# Patient Record
Sex: Male | Born: 1985 | Race: White | Hispanic: No | Marital: Single | Smoking: Heavy tobacco smoker
Health system: Southern US, Community
[De-identification: ages and names within clinical notes are randomized; demographics above are authoritative.]

---

## 2008-03-30 ENCOUNTER — Emergency Department (HOSPITAL_COMMUNITY): Admission: EM | Admit: 2008-03-30 | Discharge: 2008-03-31 | Payer: Self-pay | Admitting: Emergency Medicine

## 2014-01-03 ENCOUNTER — Emergency Department (HOSPITAL_COMMUNITY)
Admission: EM | Admit: 2014-01-03 | Discharge: 2014-01-04 | Disposition: A | Discharge status: Home / Self Care | Payer: Self-pay | Attending: Emergency Medicine | Admitting: Emergency Medicine

## 2014-01-03 ENCOUNTER — Encounter (HOSPITAL_COMMUNITY): Payer: Self-pay | Admitting: Emergency Medicine

## 2014-01-03 DIAGNOSIS — F17200 Nicotine dependence, unspecified, uncomplicated: Secondary | ICD-10-CM | POA: Insufficient documentation

## 2014-01-03 DIAGNOSIS — IMO0002 Reserved for concepts with insufficient information to code with codable children: Secondary | ICD-10-CM | POA: Insufficient documentation

## 2014-01-03 DIAGNOSIS — F1010 Alcohol abuse, uncomplicated: Secondary | ICD-10-CM | POA: Insufficient documentation

## 2014-01-03 DIAGNOSIS — R7989 Other specified abnormal findings of blood chemistry: Secondary | ICD-10-CM

## 2014-01-03 DIAGNOSIS — F1310 Sedative, hypnotic or anxiolytic abuse, uncomplicated: Secondary | ICD-10-CM | POA: Insufficient documentation

## 2014-01-03 DIAGNOSIS — F1210 Cannabis abuse, uncomplicated: Secondary | ICD-10-CM | POA: Insufficient documentation

## 2014-01-03 DIAGNOSIS — R945 Abnormal results of liver function studies: Secondary | ICD-10-CM | POA: Insufficient documentation

## 2014-01-03 DIAGNOSIS — F411 Generalized anxiety disorder: Secondary | ICD-10-CM | POA: Insufficient documentation

## 2014-01-03 LAB — CBC WITH DIFFERENTIAL/PLATELET
Basophils Absolute: 0 10*3/uL (ref 0.0–0.1)
Basophils Relative: 1 % (ref 0–1)
EOS ABS: 0 10*3/uL (ref 0.0–0.7)
EOS PCT: 0 % (ref 0–5)
HEMATOCRIT: 39.8 % (ref 39.0–52.0)
HEMOGLOBIN: 14.2 g/dL (ref 13.0–17.0)
LYMPHS ABS: 1.7 10*3/uL (ref 0.7–4.0)
Lymphocytes Relative: 30 % (ref 12–46)
MCH: 33.4 pg (ref 26.0–34.0)
MCHC: 35.7 g/dL (ref 30.0–36.0)
MCV: 93.6 fL (ref 78.0–100.0)
MONOS PCT: 13 % — AB (ref 3–12)
Monocytes Absolute: 0.7 10*3/uL (ref 0.1–1.0)
Neutro Abs: 3.1 10*3/uL (ref 1.7–7.7)
Neutrophils Relative %: 56 % (ref 43–77)
Platelets: 159 10*3/uL (ref 150–400)
RBC: 4.25 MIL/uL (ref 4.22–5.81)
RDW: 13.1 % (ref 11.5–15.5)
WBC: 5.5 10*3/uL (ref 4.0–10.5)

## 2014-01-03 LAB — COMPREHENSIVE METABOLIC PANEL
ALBUMIN: 4.5 g/dL (ref 3.5–5.2)
ALT: 319 U/L — AB (ref 0–53)
AST: 387 U/L — AB (ref 0–37)
Alkaline Phosphatase: 110 U/L (ref 39–117)
Anion gap: 17 — ABNORMAL HIGH (ref 5–15)
BUN: 5 mg/dL — ABNORMAL LOW (ref 6–23)
CALCIUM: 9 mg/dL (ref 8.4–10.5)
CO2: 25 mEq/L (ref 19–32)
CREATININE: 0.64 mg/dL (ref 0.50–1.35)
Chloride: 99 mEq/L (ref 96–112)
GFR calc Af Amer: >90 mL/min (ref >90)
GFR calc non Af Amer: >90 mL/min (ref >90)
Glucose, Bld: 118 mg/dL — ABNORMAL HIGH (ref 70–99)
Potassium: 3.6 mEq/L — ABNORMAL LOW (ref 3.7–5.3)
SODIUM: 141 meq/L (ref 137–147)
TOTAL PROTEIN: 8 g/dL (ref 6.0–8.3)
Total Bilirubin: 0.5 mg/dL (ref 0.3–1.2)

## 2014-01-03 LAB — RAPID URINE DRUG SCREEN, HOSP PERFORMED
Amphetamines: NOT DETECTED
BARBITURATES: NOT DETECTED
BENZODIAZEPINES: POSITIVE — AB
COCAINE: NOT DETECTED
Opiates: NOT DETECTED
TETRAHYDROCANNABINOL: POSITIVE — AB

## 2014-01-03 LAB — ETHANOL

## 2014-01-03 MED ORDER — NICOTINE 14 MG/24HR TD PT24
14.0000 mg | MEDICATED_PATCH | Freq: Once | TRANSDERMAL | Status: AC
  Administered 2014-01-03: 14 mg via TRANSDERMAL
  Filled 2014-01-03: qty 1

## 2014-01-03 MED ORDER — PROMETHAZINE HCL 25 MG/ML IJ SOLN
12.5000 mg | Freq: Once | INTRAMUSCULAR | Status: AC
Start: 2014-01-03 — End: 2014-01-03
  Administered 2014-01-03: 12.5 mg via INTRAVENOUS
  Filled 2014-01-03: qty 1

## 2014-01-03 MED ORDER — VITAMIN B-1 100 MG PO TABS
100.0000 mg | ORAL_TABLET | Freq: Every day | ORAL | Status: DC
  Administered 2014-01-03: 100 mg via ORAL
  Filled 2014-01-03: qty 1

## 2014-01-03 MED ORDER — LORAZEPAM 1 MG PO TABS
0.0000 mg | ORAL_TABLET | Freq: Four times a day (QID) | ORAL | Status: DC
  Administered 2014-01-03 – 2014-01-04 (×3): 1 mg via ORAL
  Filled 2014-01-03 (×3): qty 1

## 2014-01-03 MED ORDER — LORAZEPAM 1 MG PO TABS
1.0000 mg | ORAL_TABLET | Freq: Once | ORAL | Status: AC
  Administered 2014-01-03: 1 mg via ORAL
  Filled 2014-01-03: qty 1

## 2014-01-03 MED ORDER — ACETAMINOPHEN 325 MG PO TABS
650.0000 mg | ORAL_TABLET | Freq: Once | ORAL | Status: AC
  Administered 2014-01-03: 650 mg via ORAL
  Filled 2014-01-03: qty 2

## 2014-01-03 MED ORDER — ONDANSETRON 4 MG PO TBDP
4.0000 mg | ORAL_TABLET | Freq: Once | ORAL | Status: AC
  Administered 2014-01-03: 4 mg via ORAL
  Filled 2014-01-03: qty 1

## 2014-01-03 MED ORDER — THIAMINE HCL 100 MG/ML IJ SOLN: 100.0000 mg | Freq: Every day | INTRAMUSCULAR | Status: DC

## 2014-01-03 NOTE — BH Assessment (Signed)
TTS assessment in progress.   Wyllow Seigler, MS, LCASA Assessment Counselor  

## 2014-01-03 NOTE — BH Assessment (Signed)
TTS assessment complete.  Evanna Washinton, MS, LCASA Assessment Counselor  

## 2014-01-03 NOTE — BH Assessment (Signed)
TTS assessment complete.  Ciria Bernardini, MS, LCASA Assessment Counselor  

## 2014-01-03 NOTE — ED Provider Notes (Signed)
Medical screening examination/treatment/procedure(s) were performed by non-physician practitioner and as supervising physician I was immediately available for consultation/collaboration.   EKG Interpretation None       Roann Merk, MD 01/03/14 1503 

## 2014-01-03 NOTE — BH Assessment (Signed)
Pt assessment delayed due to issues with connectivity issues.   Octavis Sheeler, MS, LCASA  Assessment Counselor  

## 2014-01-03 NOTE — ED Provider Notes (Signed)
Thomas Wong: [id number]     Arrival date & time 01/03/14  4098 History   First MD Initiated Contact with Patient 01/03/14 1015     Chief Complaint  Patient presents with  . Alcohol Problem     (Consider location/radiation/quality/duration/timing/severity/associated sxs/prior Treatment) The history is provided by the patient, the spouse and a parent.   [name] is a 28 y.o. male requesting assistance with detox from alcohol.  Thomas Wong reports typically drinking 16+ beers per day and also drank rubbing alcohol approximately 3 hours ago this morning to avoid seizures.  Thomas Wong reports history of withdrawal seizures last occurring 2 days ago at which time Thomas Wong was evaluated at [facility].  Thomas Wong was given outpatient referral information, but feels like Thomas Wong needs more urgent intervention.  Thomas Wong reports feeling very anxious this morning.  Thomas Wong denies other complaints such as vomiting, confusion, chest pain, sob,  but has been nauseated. Thomas Wong denies abdominal pain.   History reviewed. No pertinent past medical history. History reviewed. No pertinent past surgical history. History reviewed. No pertinent family history. History  Substance Use Topics  . Smoking status: Heavy Tobacco Smoker -- 2.00 packs/day    Types: Cigarettes  . Smokeless tobacco: Not on file  . Alcohol Use: 120.0 oz/week    200 Cans of beer per week    Review of Systems  Constitutional: Negative for fever and chills.  HENT: Negative for congestion and sore throat.   Eyes: Negative.   Respiratory: Negative for chest tightness and shortness of breath.   Cardiovascular: Negative for chest pain.  Gastrointestinal: Negative for nausea, vomiting and abdominal pain.  Genitourinary: Negative.   Musculoskeletal: Negative for arthralgias, joint swelling and neck pain.  Skin: Negative.  Negative for rash and wound.  Neurological: Negative for dizziness, weakness, light-headedness, numbness and headaches.  Psychiatric/Behavioral: Negative for  suicidal ideas and self-injury. The patient is nervous/anxious.       Allergies  Review of patient's allergies indicates no known allergies.  Home Medications   Prior to Admission medications   Medication Sig Start Date End Date Taking? Authorizing Provider  albuterol (PROVENTIL HFA;VENTOLIN HFA) 108 (90 BASE) MCG/ACT inhaler Inhale 3 puffs into the lungs every 6 (six) hours as needed for wheezing or shortness of breath.   Yes Historical Provider, MD  DiphenhydrAMINE HCl (BENADRYL ALLERGY PO) Take 2 tablets by mouth as needed (sleep).   Yes Historical Provider, MD  diphenhydramine-acetaminophen (TYLENOL PM) 25-500 MG TABS Take 3 tablets by mouth as needed.   Yes Historical Provider, MD  ibuprofen (ADVIL,MOTRIN) 200 MG tablet Take 600 mg by mouth every 6 (six) hours as needed.    Yes Historical Provider, MD  Multiple Vitamins-Minerals (MULTIVITAMINS THER. W/MINERALS) TABS tablet Take 1 tablet by mouth daily.   Yes Historical Provider, MD   BP 138/91  Pulse 105  Temp(Src) 98.2 F (36.8 C) (Oral)  Resp 16  Ht  (1.727 m)  Wt 145 lb (65.772 kg)  BMI 22.05 kg/m2  SpO2 100% Physical Exam  Nursing note and vitals reviewed. Constitutional: Thomas Wong appears well-developed and well-nourished.  HENT:  Head: Normocephalic and atraumatic.  Eyes: Conjunctivae are normal.  Neck: Normal range of motion.  Cardiovascular: Normal rate, regular rhythm, normal heart sounds and intact distal pulses.   Pulmonary/Chest: Effort normal. Thomas Wong has wheezes. Thomas Wong exhibits no tenderness.  Bilateral lower expiratory wheeze which resolves after several breaths  Abdominal: Soft. Bowel sounds are normal. There is no tenderness.  Musculoskeletal: Normal range of motion.  Neurological:  Thomas Wong is alert.  Skin: Skin is warm and dry.  Psychiatric: His speech is normal. His mood appears anxious. Thomas Wong is agitated.    ED Course  Procedures (including critical care time) Labs Review Labs Reviewed  URINE RAPID DRUG SCREEN  (HOSP PERFORMED) - Abnormal; Notable for the following:    Benzodiazepines POSITIVE (*)    Tetrahydrocannabinol POSITIVE (*)    All other components within normal limits  COMPREHENSIVE METABOLIC PANEL - Abnormal; Notable for the following:    Potassium 3.6 (*)    Glucose, Bld 118 (*)    BUN 5 (*)    AST 387 (*)    ALT 319 (*)    Anion gap 17 (*)    All other components within normal limits  CBC WITH DIFFERENTIAL - Abnormal; Notable for the following:    Monocytes Relative 13 (*)    All other components within normal limits  ETHANOL    Imaging Review No results found.   EKG Interpretation None      MDM   Final diagnoses:  ETOH abuse  Elevated LFTs    Pt was given IV fluids, zofran, placed on CIWA protocol for acute etoh withdrawal. Will have ttp evaluate for detox/placement.  Labs reviewed. Elevated lft's.  Pt denies any prior liver problems.      Burgess Amor, PA-C 01/03/14 1405  Burgess Amor, PA-C 01/03/14 1455

## 2014-01-03 NOTE — ED Notes (Signed)
Pt states he wants help for alcohol addiction, typically drinks 16 beers a day, last beer yesterday but did drink rubbing alcohol this morning. Pt seen for same at [facility] but received no help. Pt states he has had seizures for trying to self detox.

## 2014-01-03 NOTE — BH Assessment (Signed)
This Clinical research associate spoke with Lynden Ang at [facility] who reports "It is likely that we will have a bed available" she recommended that patient referral information be faxed for review.   Spoke with Andrey Campanile at [facility] whom reports that they have beds available. Oncoming shift will need to fax patient referral information to [facility] for review and bed consideration.  Glorious Peach, MS, LCASA Assessment Counselor

## 2014-01-03 NOTE — BH Assessment (Signed)
Tele Assessment Note   Thomas Wong is an 28 y.o. male. Pt presents to [facility] with c/o medical clearance and is requesting inpatient detox from [facility]. Patient reports that he is looking for longer term substance abuse treatment if available. Pt reports that he has been drinking etoh excessively daily for the past 6 years. Patient reports that he last drank etoh on 01/03/14 reporting that he can't remember how much etoh he drank. Pt denies the use of any other elicit drugs or secondary substances but UDS is + for THC. Pt reports that he uses Xanax bars when he can't get access to etoh. Patient reports that he consumed a Xanax bar 3-4 days ago. Pt suspects that he had a seizure last weekend but did not seek medical attention to confirm. Pt denies history of D/T's but reports hand tremors and shakes during withdraw from etoh. Pt denies SI,HI, and no AVH reported.  Consulted with [facility] and Dr. Lucianne Muss whom is recommending that patient be referred to other facilities for detox treatment. [facility] have no available [address] beds at this time.  Axis I: Alcohol Use Disorder Axis II: Deferred Axis III: History reviewed. No pertinent past medical history. Axis IV: economic problems, problems related to legal system/crime and problems related to social environment Axis V: 41-50 serious symptoms  Past Medical History: History reviewed. No pertinent past medical history.  History reviewed. No pertinent past surgical history.  Family History: History reviewed. No pertinent family history.  Social History:  reports that he has been smoking Cigarettes.  He has been smoking about 2.00 packs per day. He does not have any smokeless tobacco history on file. He reports that he drinks about 120 ounces of alcohol per week. He reports that he uses illicit drugs (Marijuana).  Additional Social History:  Alcohol / Drug Use History of alcohol / drug use?: Yes Negative Consequences of Use: Financial;Personal  relationships;Legal Substance #1 Name of Substance 1:  (Etoh-Beer) 1 - Age of First Use:  (18) 1 - Amount (size/oz):  (12-16 (12)oz beers) 1 - Frequency:  (daily) 1 - Duration:  (pt reports daily use for the past 6 years) 1 - Last Use / Amount:  (01/03/14-Pt is unable to specify amount)  CIWA: CIWA-Ar BP: 133/76 mmHg Pulse Rate: 103 Nausea and Vomiting: mild nausea with no vomiting Tactile Disturbances: none Tremor: two Auditory Disturbances: not present Paroxysmal Sweats: no sweat visible Visual Disturbances: not present Anxiety: mildly anxious Headache, Fullness in Head: none present Agitation: somewhat more than normal activity Orientation and Clouding of Sensorium: oriented and can do serial additions CIWA-Ar Total: 5 COWS:    PATIENT STRENGTHS: (choose at least two) Average or above average intelligence Supportive family/friends  Allergies: No Known Allergies  Home Medications:  (Not in a hospital admission)  OB/GYN Status:  No LMP for male patient.  General Assessment Data Location of Assessment: [facility] Is this a Tele or Face-to-Face Assessment?: Tele Assessment Is this an Initial Assessment or a Re-assessment for this encounter?: Initial Assessment Living Arrangements: Non-relatives/Friends (pt reports that he lives with his girlfriend) Can pt return to current living arrangement?: Yes Admission Status: Voluntary Is patient capable of signing voluntary admission?: Yes Transfer from: Unknown Referral Source: MD     [facility] Crisis Care Plan Living Arrangements: Non-relatives/Friends (pt reports that he lives with his girlfriend) Name of Psychiatrist: No Current Provider Name of Therapist: No Current Provider     Risk to self with the past 6 months Suicidal  Ideation: No Suicidal Intent: No Is patient at risk for suicide?: No Suicidal Plan?: No Access to Means: No What has been your use of drugs/alcohol within the last 12 months?: Etoh, THC, Xanax  Bars Previous Attempts/Gestures: No How many times?: 0 Other Self Harm Risks: none reported Triggers for Past Attempts: None known Intentional Self Injurious Behavior: None Family Suicide History: No Persecutory voices/beliefs?: No Depression: Yes Depression Symptoms: Feeling angry/irritable Substance abuse history and/or treatment for substance abuse?: Yes  Risk to Others within the past 6 months Homicidal Ideation: No Thoughts of Harm to Others: No Current Homicidal Intent: No Current Homicidal Plan: No Access to Homicidal Means: No Identified Victim: na History of harm to others?: No Assessment of Violence: None Noted Violent Behavior Description: None Noted Does patient have access to weapons?: No Criminal Charges Pending?: No Does patient have a court date: No  Psychosis Hallucinations: None noted Delusions: None noted  Mental Status Report Appear/Hygiene: Other (Comment) (casual) Eye Contact: Fair Motor Activity: Freedom of movement Speech: Logical/coherent Level of Consciousness: Alert;Irritable Mood: Anxious;Angry;Irritable Affect: Irritable Anxiety Level: Minimal Thought Processes: Coherent;Relevant Judgement: Impaired Orientation: Person;Place;Time;Situation Obsessive Compulsive Thoughts/Behaviors: None  Cognitive Functioning Concentration: Normal Memory: Recent Intact;Remote Intact IQ: Average Insight: Poor Impulse Control: Fair Appetite: Poor Weight Loss: 0 Weight Gain: 0 Sleep: Decreased Total Hours of Sleep:  (4-6 hours of sleep per night) Vegetative Symptoms: None  ADLScreening [facility] Assessment Services) Patient's cognitive ability adequate to safely complete daily activities?: Yes Patient able to express need for assistance with ADLs?: Yes Independently performs ADLs?: Yes (appropriate for developmental age)  Prior Inpatient Therapy Prior Inpatient Therapy: No Prior Therapy Dates: na Prior Therapy Facilty/Provider(s): na Reason for  Treatment: na  Prior Outpatient Therapy Prior Outpatient Therapy: No Prior Therapy Dates: na Prior Therapy Facilty/Provider(s): na Reason for Treatment: na  ADL Screening (condition at time of admission) Patient's cognitive ability adequate to safely complete daily activities?: Yes Is the patient deaf or have difficulty hearing?: No Does the patient have difficulty seeing, even when wearing glasses/contacts?: No Does the patient have difficulty concentrating, remembering, or making decisions?: No Patient able to express need for assistance with ADLs?: Yes Does the patient have difficulty dressing or bathing?: No Independently performs ADLs?: Yes (appropriate for developmental age) Does the patient have difficulty walking or climbing stairs?: No Weakness of Legs: None Weakness of Arms/Hands: None  Home Assistive Devices/Equipment Home Assistive Devices/Equipment: None    Abuse/Neglect Assessment (Assessment to be complete while patient is alone) Physical Abuse: Denies Verbal Abuse: Denies Sexual Abuse: Denies Exploitation of patient/patient's resources: Denies Values / Beliefs Cultural Requests During Hospitalization: None Spiritual Requests During Hospitalization: None   Advance Directives (For Healthcare) Does patient have an advance directive?: No Would patient like information on creating an advanced directive?: No - patient declined information    Additional Information 1:1 In Past 12 Months?: No CIRT Risk: No Elopement Risk: No Does patient have medical clearance?: Yes     Disposition:  Disposition Initial Assessment Completed for this Encounter: Yes Disposition of Patient: Other dispositions (Per Dr.Kumar pt needs to be referred to [facility] or [facility] for tx.) Other disposition(s): Other (Comment)  Bjorn Pippin 01/03/2014 6:52 PM

## 2014-01-03 NOTE — BH Assessment (Signed)
TTS assessment in progress.   [name], MS, LCASA Assessment Counselor  

## 2014-01-03 NOTE — BH Assessment (Signed)
Pt assessment delayed due to issues with connectivity issues.   Glorious Peach, MS, LCASA  Assessment Counselor

## 2014-01-03 NOTE — BH Assessment (Addendum)
Attempted to reach ED provider prior to assessment unable to reach ED provider and evaluated patient so that patient care would not be delayed.   Glorious Peach, MS, LCASA  Assessment Counselor

## 2014-01-03 NOTE — ED Notes (Signed)
Telepsych in progress. 

## 2014-01-03 NOTE — BH Assessment (Signed)
Attempted to reach ED provider prior to assessment unable to reach ED provider and evaluated patient so that patient care would not be delayed.   Malaak Stach, MS, LCASA  Assessment Counselor       

## 2014-01-04 NOTE — BH Assessment (Signed)
[facility] Assessment Progress Note   This clinician called Sandy at [facility] to see if they had male beds available.  They do.  Referral sent to them for review.

## 2014-01-04 NOTE — ED Provider Notes (Signed)
Patient in the emergency room for substance abuse problems.   Vital signs are stable this morning.  Patient is resting comfortably.  We'll continue to monitor pending psychiatric placement  Linwood Dibbles, MD 01/04/14 [phone]

## 2021-04-05 ENCOUNTER — Other Ambulatory Visit: Payer: Self-pay

## 2021-04-05 ENCOUNTER — Emergency Department (HOSPITAL_COMMUNITY): Payer: BC Managed Care – PPO

## 2021-04-05 ENCOUNTER — Emergency Department (HOSPITAL_COMMUNITY)
Admission: EM | Admit: 2021-04-05 | Discharge: 2021-04-05 | Disposition: A | Discharge status: Home / Self Care | Payer: BC Managed Care – PPO | Attending: Emergency Medicine | Admitting: Emergency Medicine

## 2021-04-05 ENCOUNTER — Encounter (HOSPITAL_COMMUNITY): Payer: Self-pay

## 2021-04-05 DIAGNOSIS — J320 Chronic maxillary sinusitis: Secondary | ICD-10-CM | POA: Diagnosis not present

## 2021-04-05 DIAGNOSIS — F17210 Nicotine dependence, cigarettes, uncomplicated: Secondary | ICD-10-CM | POA: Diagnosis not present

## 2021-04-05 DIAGNOSIS — R220 Localized swelling, mass and lump, head: Secondary | ICD-10-CM | POA: Diagnosis present

## 2021-04-05 DIAGNOSIS — K047 Periapical abscess without sinus: Secondary | ICD-10-CM | POA: Diagnosis not present

## 2021-04-05 DIAGNOSIS — K029 Dental caries, unspecified: Secondary | ICD-10-CM | POA: Insufficient documentation

## 2021-04-05 DIAGNOSIS — L03211 Cellulitis of face: Secondary | ICD-10-CM | POA: Insufficient documentation

## 2021-04-05 LAB — COMPREHENSIVE METABOLIC PANEL
ALT: 17 U/L (ref 0–44)
AST: 21 U/L (ref 15–41)
Albumin: 3.7 g/dL (ref 3.5–5.0)
Alkaline Phosphatase: 84 U/L (ref 38–126)
Anion gap: 10 (ref 5–15)
BUN: 15 mg/dL (ref 6–20)
CO2: 27 mmol/L (ref 22–32)
Calcium: 8.7 mg/dL — ABNORMAL LOW (ref 8.9–10.3)
Chloride: 102 mmol/L (ref 98–111)
Creatinine, Ser: 0.62 mg/dL (ref 0.61–1.24)
GFR, Estimated: >60 mL/min (ref >60)
Glucose, Bld: 87 mg/dL (ref 70–99)
Potassium: 3.9 mmol/L (ref 3.5–5.1)
Sodium: 139 mmol/L (ref 135–145)
Total Bilirubin: 0.1 mg/dL — ABNORMAL LOW (ref 0.3–1.2)
Total Protein: 6.8 g/dL (ref 6.5–8.1)

## 2021-04-05 LAB — CBC
HCT: 31.2 % — ABNORMAL LOW (ref 39.0–52.0)
Hemoglobin: 10.3 g/dL — ABNORMAL LOW (ref 13.0–17.0)
MCH: 29.6 pg (ref 26.0–34.0)
MCHC: 33 g/dL (ref 30.0–36.0)
MCV: 89.7 fL (ref 80.0–100.0)
Platelets: 265 10*3/uL (ref 150–400)
RBC: 3.48 MIL/uL — ABNORMAL LOW (ref 4.22–5.81)
RDW: 13 % (ref 11.5–15.5)
WBC: 5.9 10*3/uL (ref 4.0–10.5)
nRBC: 0 % (ref 0.0–0.2)

## 2021-04-05 MED ORDER — SULFAMETHOXAZOLE-TRIMETHOPRIM 800-160 MG PO TABS: 1.0000 | ORAL_TABLET | Freq: Two times a day (BID) | ORAL | 0 refills | Status: AC

## 2021-04-05 MED ORDER — AMOXICILLIN-POT CLAVULANATE 875-125 MG PO TABS: 1.0000 | ORAL_TABLET | Freq: Two times a day (BID) | ORAL | 0 refills | Status: AC

## 2021-04-05 MED ORDER — IOHEXOL 300 MG/ML  SOLN
75.0000 mL | Freq: Once | INTRAMUSCULAR | Status: AC | PRN
  Administered 2021-04-05: 75 mL via INTRAVENOUS

## 2021-04-05 NOTE — ED Triage Notes (Signed)
Pt c/o dental abscess, pt has swelling to lower left jaw. Pt states his mother gave him amoxicillin last week and hasn't improved.

## 2021-04-05 NOTE — Discharge Instructions (Signed)
You were evaluated in the Emergency Department and after careful evaluation, we did not find any emergent condition requiring admission or further testing in the hospital.  Your exam/testing today was overall reassuring.  Symptoms seem to be due to a cellulitis of the face.  Take the antibiotics as directed.  Recommend following up with primary care doctor as well as dentist.  Please return to the Emergency Department if you experience any worsening of your condition.  Thank you for allowing [place] to be a part of your care.

## 2021-04-05 NOTE — ED Provider Notes (Signed)
[facility] Emergency Department Provider Note MRN:  [id number]  Arrival date & time: 04/05/21     Chief Complaint   Facial swelling History of Present Illness   Thomas Wong is a 35 y.o. year-old male with no pertinent past medical presenting to the ED with chief complaint of facial swelling.  Left lower tooth pain and facial swelling that started about a week ago.  Improved after taking some of family members amoxicillin but then stopped taking the amoxicillin and then it returned.  Restarted the amoxicillin but still getting worse.  Pain and swelling to the left side of the jaw, pain is minimal.  Has pain when he tries to bite down.  Denies fever, no other complaints.  Review of Systems  A complete 10 system review of systems was obtained and all systems are negative except as noted in the HPI and PMH.   Patient's Health History   History reviewed. No pertinent past medical history.  No past surgical history on file.  No family history on file.  Social History   Socioeconomic History   Marital status: Single    Spouse name: Not on file   Number of children: Not on file   Years of education: Not on file   Highest education level: Not on file  Occupational History   Not on file  Tobacco Use   Smoking status: Heavy Smoker    Packs/day: 2.00    Types: Cigarettes   Smokeless tobacco: Not on file  Substance and Sexual Activity   Alcohol use: Yes    Alcohol/week: 200.0 standard drinks    Types: 200 Cans of beer per week   Drug use: Yes    Types: Marijuana    Comment: pt tested + for [facility] but denied use on 01/03/14, reports that he consumes Xanax Bars when he cant get access to Etoh   Sexual activity: Not on file  Other Topics Concern   Not on file  Social History Narrative   Not on file   Social Determinants of Health   Financial Resource Strain: Not on file  Food Insecurity: Not on file  Transportation Needs: Not on file  Physical  Activity: Not on file  Stress: Not on file  Social Connections: Not on file  Intimate Partner Violence: Not on file     Physical Exam   Vitals:   04/05/21 0356  BP: 125/87  Pulse: 84  Resp: 20  Temp: 98.2 F (36.8 C)  SpO2: 100%    CONSTITUTIONAL: Well-appearing, NAD NEURO:  Alert and oriented x 3, no focal deficits EYES:  eyes equal and reactive ENT/NECK:  no LAD, no JVD; swelling and mild tenderness to the left mandible, generally poor dentition CARDIO: Regular rate, well-perfused, normal S1 and S2 PULM:  CTAB no wheezing or rhonchi GI/GU:  normal bowel sounds, non-distended, non-tender MSK/SPINE:  No gross deformities, no edema SKIN:  no rash, atraumatic PSYCH:  Appropriate speech and behavior  *Additional and/or pertinent findings included in MDM below  Diagnostic and Interventional Summary    EKG Interpretation  Date/Time:    Ventricular Rate:    PR Interval:    QRS Duration:   QT Interval:    QTC Calculation:   R Axis:     Text Interpretation:         Labs Reviewed  CBC - Abnormal; Notable for the following components:      Result Value   RBC 3.48 (*)    Hemoglobin 10.3 (*)  HCT 31.2 (*)    All other components within normal limits  COMPREHENSIVE METABOLIC PANEL - Abnormal; Notable for the following components:   Calcium 8.7 (*)    Total Bilirubin 0.1 (*)    All other components within normal limits    CT Maxillofacial W Contrast  Final Result      Medications  iohexol (OMNIPAQUE) 300 MG/ML solution 75 mL (75 mLs Intravenous Contrast Given 04/05/21 0552)     Procedures  /  Critical Care Procedures  ED Course and Medical Decision Making  I have reviewed the triage vital signs, the nursing notes, and pertinent available records from the EMR.  Listed above are laboratory and imaging tests that I personally ordered, reviewed, and interpreted and then considered in my medical decision making (see below for details).  CT to evaluate for  abscess or bony involvement.     CT revealing facial cellulitis, no abscess in the area of question.  Continues to look well, no significant worsening of swelling here, no airway compromise, normal vital signs, appropriate for discharge.  Given his use of amoxicillin at home without improvement, will escalate therapy.  Elmer Sow. Pilar Plate, MD [facility] Emergency Medicine [facility] [email or web address]  Final Clinical Impressions(s) / ED Diagnoses     ICD-10-CM   1. Cellulitis of face  L03.211     2. Tooth infection  K04.7       ED Discharge Orders          Ordered    sulfamethoxazole-trimethoprim (BACTRIM DS) 800-160 MG tablet  2 times daily        04/05/21 0650    amoxicillin-clavulanate (AUGMENTIN) 875-125 MG tablet  Every 12 hours        04/05/21 0650             Discharge Instructions Discussed with and Provided to Patient:     Discharge Instructions      You were evaluated in the Emergency Department and after careful evaluation, we did not find any emergent condition requiring admission or further testing in the hospital.  Your exam/testing today was overall reassuring.  Symptoms seem to be due to a cellulitis of the face.  Take the antibiotics as directed.  Recommend following up with primary care doctor as well as dentist.  Please return to the Emergency Department if you experience any worsening of your condition.  Thank you for allowing [place] to be a part of your care.         Sabas Sous, MD 04/05/21 [phone]

## 2022-12-14 IMAGING — CT CT MAXILLOFACIAL W/ CM
3 of 4 series · 16 of 47 positions shown, 19 images · IV contrast (Omnipaque or Isovue)
Comparison: None.

CLINICAL DATA: Dental abscess with left jaw swelling for over 2
weeks

EXAM:
CT MAXILLOFACIAL WITH CONTRAST
TECHNIQUE: Multidetector CT imaging of the maxillofacial structures was
performed with intravenous contrast. Multiplanar CT image
reconstructions were also generated.
CONTRAST:  75mL OMNIPAQUE IOHEXOL 300 MG/ML  SOLN

[Series 3: max soft · axial · 0.40mm/px · z∈[+10,+170]mm · 12 of 94 slices shown, 15 images]
[im 7/94  brain]
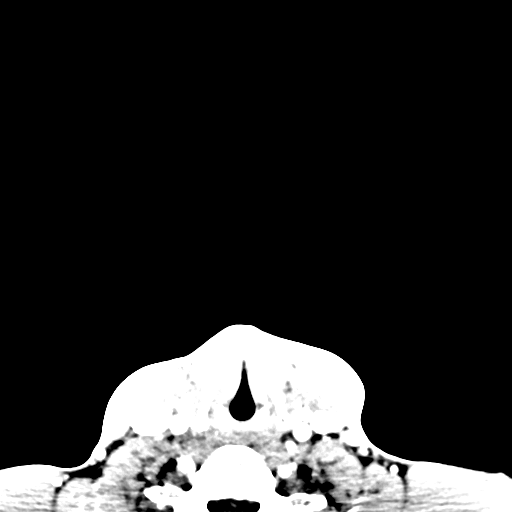
[im 7/94  bone]
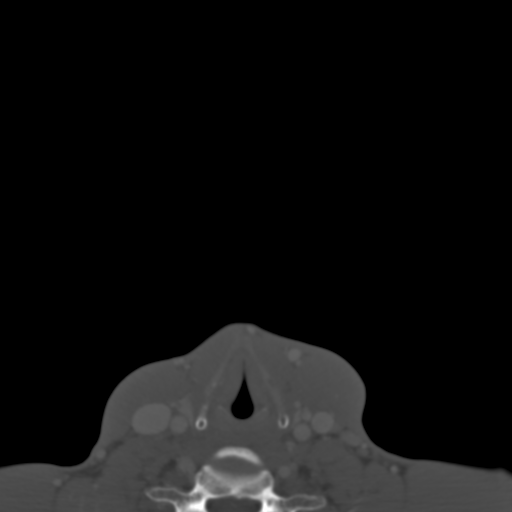
[im 13/94  bone]
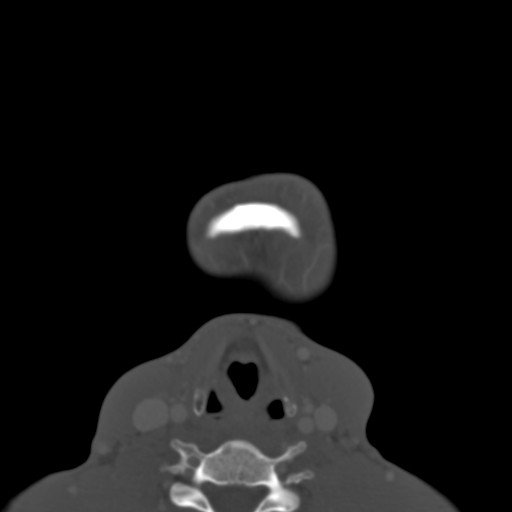
[im 20/94  bone]
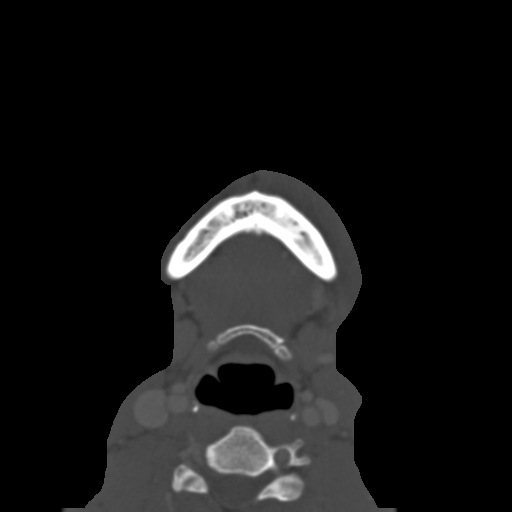
[im 29/94  bone]
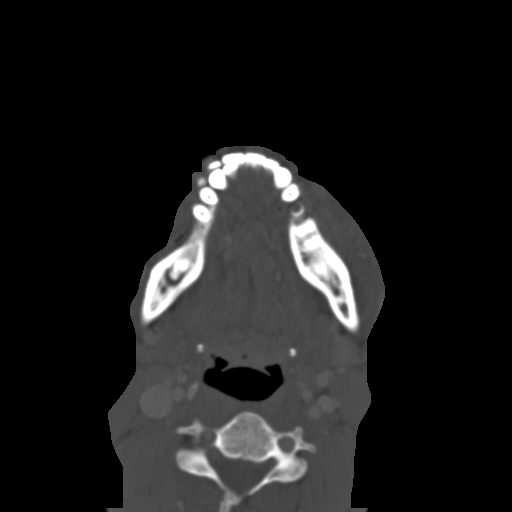
[im 36/94  brain]
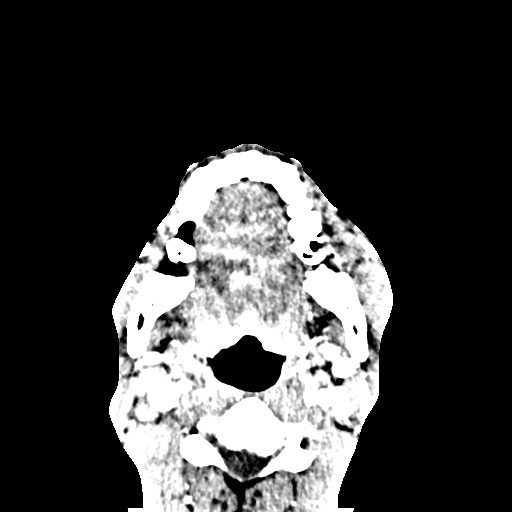
[im 36/94  bone]
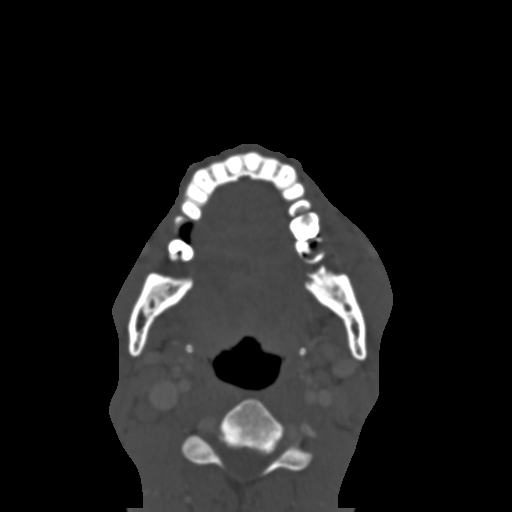
[im 42/94  bone]
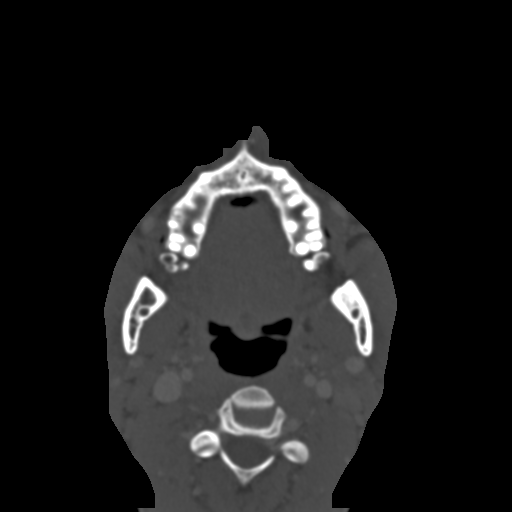
[im 52/94  bone]
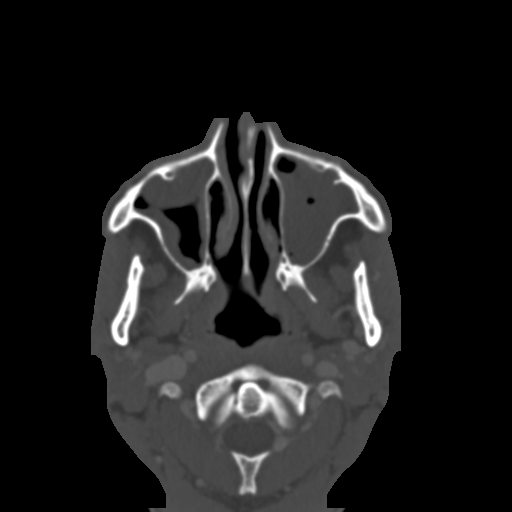
[im 58/94  bone]
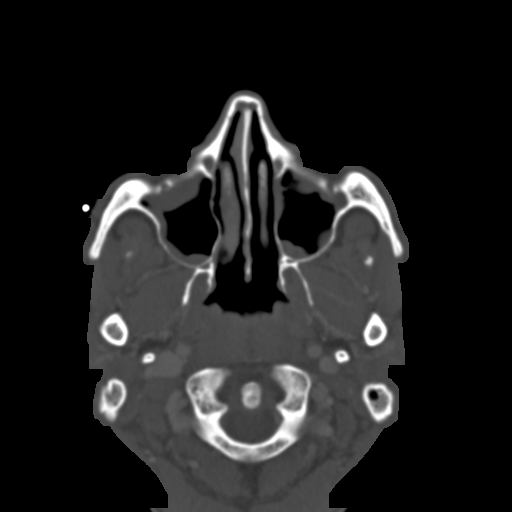
[im 65/94  brain]
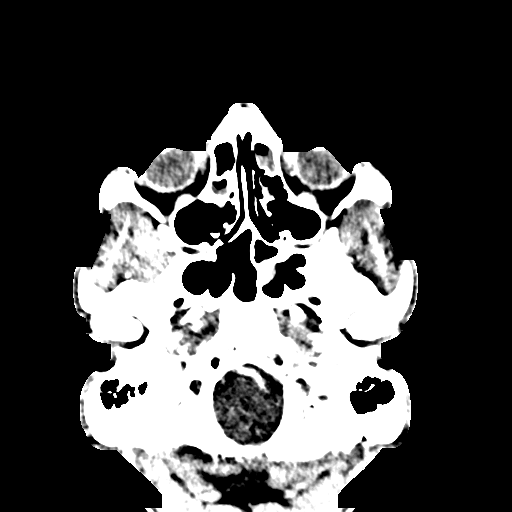
[im 65/94  bone]
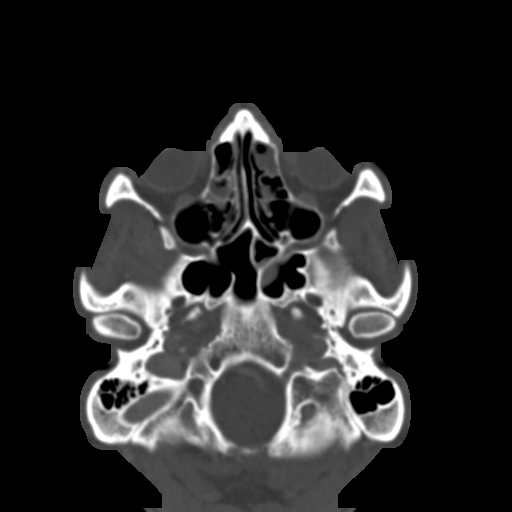
[im 74/94  bone]
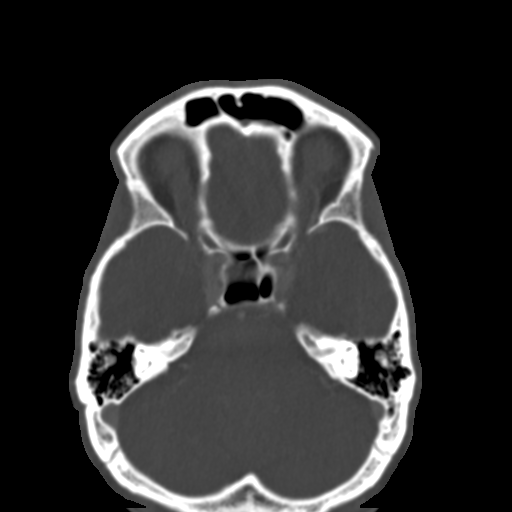
[im 81/94  bone]
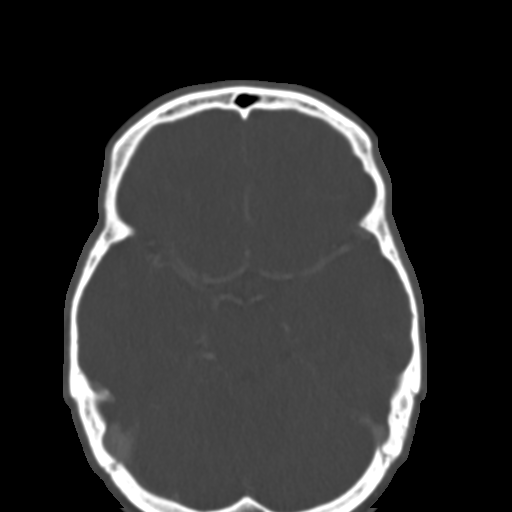
[im 87/94  bone]
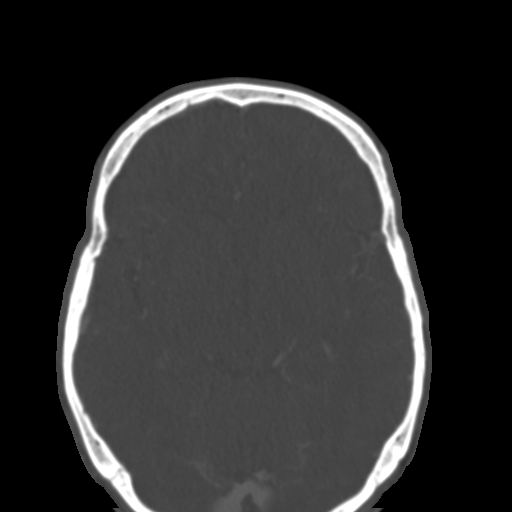

[Series 5: coronal soft · coronal · 0.38mm/px · 3 of 85 slices shown]
[im 29/85  bone]
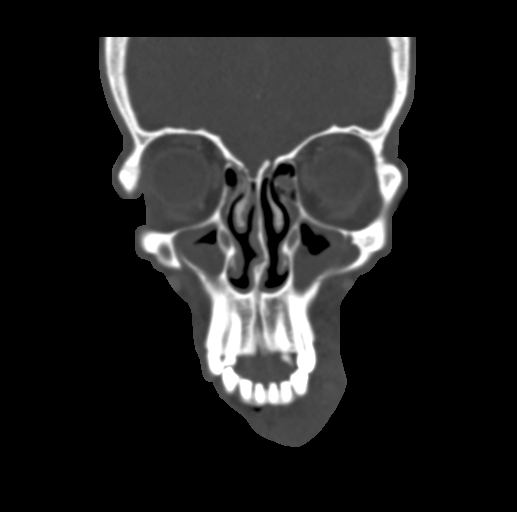
[im 38/85  bone]
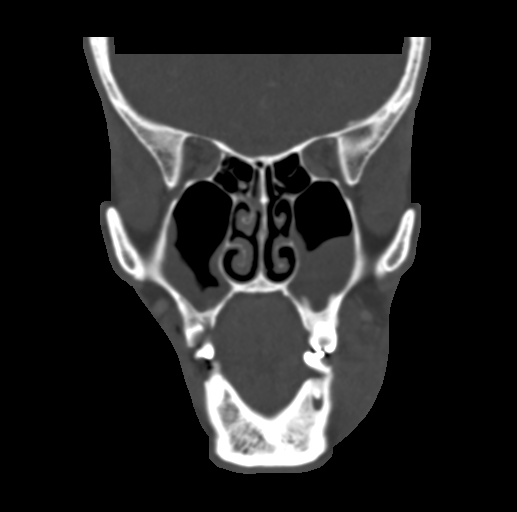
[im 47/85  bone]
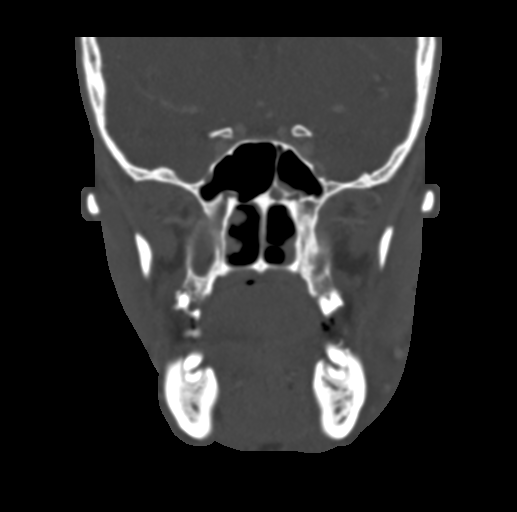

[Series 8: sagittal bone · sagittal · 0.38mm/px · 1 of 87 slices shown]
[im 44/87  bone]
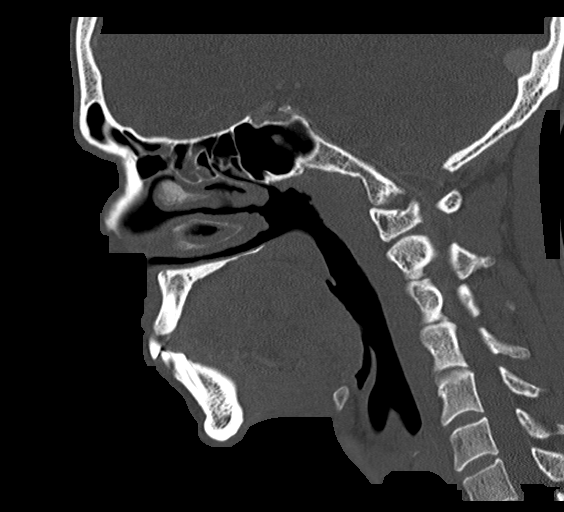

[16 of 47 positions shown; findings below may reference images not displayed]

FINDINGS: Osseous: Dental caries with periapical lucencies most notably
affecting tooth 20 where there is an anterior alveolar dehiscence
leading to cellulitic left cheek soft tissues. No drainable
collection seen at this level. Additional notable tooth 13 apical
dehiscence with rim enhancing collection in the inferior left
maxillary sinus, fluid measuring 3-4 mm. No evidence of
osteomyelitis. No floor of mouth swelling.

Orbits: Negative

Sinuses: Mucosal thickening in the bilateral maxillary sinuses, at
least partially odontogenic. Milder patchy opacification in the
ethmoids.

Soft tissues: Facial cellulitis as noted above

Limited intracranial: Negative
IMPRESSION: 1. Odontogenic cellulitis in the left face related to tooth 20.
2. Tooth 13 periapical dehiscence into the left maxillary sinus with
sinusitis and 13 mm collection.
3. Widespread advanced dental caries.

## 2023-04-21 ENCOUNTER — Other Ambulatory Visit: Payer: Self-pay

## 2023-04-21 ENCOUNTER — Encounter (HOSPITAL_COMMUNITY): Payer: Self-pay | Admitting: Emergency Medicine

## 2023-04-21 ENCOUNTER — Emergency Department (HOSPITAL_COMMUNITY)
Admission: EM | Admit: 2023-04-21 | Discharge: 2023-04-22 | Disposition: A | Discharge status: Home / Self Care | Payer: BC Managed Care – PPO | Attending: Emergency Medicine | Admitting: Emergency Medicine

## 2023-04-21 ENCOUNTER — Emergency Department (HOSPITAL_COMMUNITY): Payer: BC Managed Care – PPO

## 2023-04-21 DIAGNOSIS — L03012 Cellulitis of left finger: Secondary | ICD-10-CM | POA: Insufficient documentation

## 2023-04-21 LAB — CBC WITH DIFFERENTIAL/PLATELET
Abs Immature Granulocytes: 0.08 10*3/uL — ABNORMAL HIGH (ref 0.00–0.07)
Basophils Absolute: 0 10*3/uL (ref 0.0–0.1)
Basophils Relative: 0 %
Eosinophils Absolute: 0.2 10*3/uL (ref 0.0–0.5)
Eosinophils Relative: 2 %
HCT: 34.9 % — ABNORMAL LOW (ref 39.0–52.0)
Hemoglobin: 11.4 g/dL — ABNORMAL LOW (ref 13.0–17.0)
Immature Granulocytes: 1 %
Lymphocytes Relative: 26 %
Lymphs Abs: 2.1 10*3/uL (ref 0.7–4.0)
MCH: 29.2 pg (ref 26.0–34.0)
MCHC: 32.7 g/dL (ref 30.0–36.0)
MCV: 89.3 fL (ref 80.0–100.0)
Monocytes Absolute: 0.7 10*3/uL (ref 0.1–1.0)
Monocytes Relative: 8 %
Neutro Abs: 5.3 10*3/uL (ref 1.7–7.7)
Neutrophils Relative %: 63 %
Platelets: 387 10*3/uL (ref 150–400)
RBC: 3.91 MIL/uL — ABNORMAL LOW (ref 4.22–5.81)
RDW: 12.5 % (ref 11.5–15.5)
WBC: 8.4 10*3/uL (ref 4.0–10.5)
nRBC: 0 % (ref 0.0–0.2)

## 2023-04-21 NOTE — ED Triage Notes (Signed)
Pt in with ongoing pain and swelling to L distal index finger. Pt states he was dx with cellulitis and had the finger lanced 5 days ago at an UC. Pt states he has been on Clindamycin since, and maybe missed 2 doses. Finger red, warm and swollen

## 2023-04-22 LAB — COMPREHENSIVE METABOLIC PANEL
ALT: 15 U/L (ref 0–44)
AST: 14 U/L — ABNORMAL LOW (ref 15–41)
Albumin: 3.7 g/dL (ref 3.5–5.0)
Alkaline Phosphatase: 83 U/L (ref 38–126)
Anion gap: 9 (ref 5–15)
BUN: 18 mg/dL (ref 6–20)
CO2: 27 mmol/L (ref 22–32)
Calcium: 9.4 mg/dL (ref 8.9–10.3)
Chloride: 102 mmol/L (ref 98–111)
Creatinine, Ser: 0.84 mg/dL (ref 0.61–1.24)
GFR, Estimated: >60 mL/min (ref >60)
Glucose, Bld: 124 mg/dL — ABNORMAL HIGH (ref 70–99)
Potassium: 4 mmol/L (ref 3.5–5.1)
Sodium: 138 mmol/L (ref 135–145)
Total Bilirubin: 0.3 mg/dL (ref <1.2)
Total Protein: 7.6 g/dL (ref 6.5–8.1)

## 2023-04-22 MED ORDER — OXYCODONE-ACETAMINOPHEN 5-325 MG PO TABS
1.0000 | ORAL_TABLET | Freq: Once | ORAL | Status: AC
Start: 2023-04-22 — End: 2023-04-22
  Administered 2023-04-22: 1 via ORAL
  Filled 2023-04-22: qty 1

## 2023-04-22 MED ORDER — CHLORHEXIDINE GLUCONATE 4 % EX SOLN: Freq: Every day | CUTANEOUS | 0 refills | Status: AC | PRN

## 2023-04-22 MED ORDER — OXYCODONE-ACETAMINOPHEN 5-325 MG PO TABS: 1.0000 | ORAL_TABLET | Freq: Once | ORAL | Status: DC

## 2023-04-22 NOTE — ED Notes (Signed)
Pt is having white/pink discharge coming from left pointer finger tip

## 2023-04-22 NOTE — ED Notes (Signed)
Pt's left hand soaking in warm water.

## 2023-04-22 NOTE — Discharge Instructions (Signed)
You were seen today with ongoing concerns for redness and pain of the fingertip.  You have what is called a felon.  This is a pus collection at the tip of the finger.  Continue your antibiotics.  You need to soak in warm water and Hibiclens for 20 minutes 4 times daily to continue to allow it to drain.  Follow-up closely with hand surgery.  If you develop worsening swelling, redness, fevers or systemic symptoms, you should be reevaluated.

## 2023-04-22 NOTE — ED Notes (Signed)
ED Provider at bedside. 

## 2023-04-22 NOTE — ED Provider Notes (Signed)
[place] EMERGENCY DEPARTMENT AT [facility] Provider Note   CSN: [id number] Arrival date & time: 04/21/23  2248     History  Chief Complaint  Patient presents with   Hand Pain    Thomas Wong is a 37 y.o. male.  HPI     This is a 37 year old male who presents with concern for finger infection.  States he was seen and evaluated on December 5 and was told he had cellulitis of the finger.  He had incision and drainage of the finger and was started on clindamycin.  He has been mostly compliant with medication and maybe has missed 1-2 doses because of sleeping.  Otherwise he is noted that his finger has remained somewhat swollen.  He has some ongoing pain as well.  States that there has been some ongoing drainage after packing removal.  Has not had any fevers.  This is his nondominant hand.  Home Medications Prior to Admission medications   Medication Sig Start Date End Date Taking? Authorizing Provider  chlorhexidine (HIBICLENS) 4 % external liquid Apply topically daily as needed. 04/22/23  Yes Norleen Xie, Mayer Masker, MD  albuterol (PROVENTIL HFA;VENTOLIN HFA) 108 (90 BASE) MCG/ACT inhaler Inhale 3 puffs into the lungs every 6 (six) hours as needed for wheezing or shortness of breath.    [provider]  DiphenhydrAMINE HCl (BENADRYL ALLERGY PO) Take 2 tablets by mouth as needed (sleep).    [provider]  diphenhydramine-acetaminophen (TYLENOL PM) 25-500 MG TABS Take 3 tablets by mouth as needed.    [provider]  ibuprofen (ADVIL,MOTRIN) 200 MG tablet Take 600 mg by mouth every 6 (six) hours as needed.     [provider]  Multiple Vitamins-Minerals (MULTIVITAMINS THER. W/MINERALS) TABS tablet Take 1 tablet by mouth daily.    [provider]      Allergies    Patient has no known allergies.    Review of Systems   Review of Systems  Constitutional:  Negative for fever.  Musculoskeletal:        Finger pain  All other  systems reviewed and are negative.   Physical Exam Updated Vital Signs BP 129/85   Pulse 96   Temp 98.4 F (36.9 C) (Oral)   Resp 17   Wt 70.3 kg   SpO2 98%   BMI 23.57 kg/m  Physical Exam Vitals and nursing note reviewed.  Constitutional:      Appearance: He is well-developed. He is not ill-appearing.  HENT:     Head: Normocephalic and atraumatic.  Eyes:     Pupils: Pupils are equal, round, and reactive to light.  Cardiovascular:     Rate and Rhythm: Normal rate and regular rhythm.  Pulmonary:     Effort: Pulmonary effort is normal. No respiratory distress.  Musculoskeletal:     Cervical back: Neck supple.     Comments: Focused examination of the left hand with fusiform swelling of the left second digit, there is slight erythema circumferentially distal to the PIP joint, noted scabbing and incision on the dorsum of the pad of the finger with ongoing fluctuance noted  Lymphadenopathy:     Cervical: No cervical adenopathy.  Skin:    General: Skin is warm and dry.  Neurological:     Mental Status: He is alert and oriented to person, place, and time.  Psychiatric:        Mood and Affect: Mood normal.        ED Results /  Procedures / Treatments   Labs (all labs ordered are listed, but only abnormal results are displayed) Labs Reviewed  COMPREHENSIVE METABOLIC PANEL - Abnormal; Notable for the following components:      Result Value   Glucose, Bld 124 (*)    AST 14 (*)    All other components within normal limits  CBC WITH DIFFERENTIAL/PLATELET - Abnormal; Notable for the following components:   RBC 3.91 (*)    Hemoglobin 11.4 (*)    HCT 34.9 (*)    Abs Immature Granulocytes 0.08 (*)    All other components within normal limits    EKG None  Radiology DG Hand Complete Left  Result Date: 04/21/2023 CLINICAL DATA:  Cellulitis of the left index finger. EXAM: LEFT HAND - COMPLETE 3+ VIEW COMPARISON:  None Available. FINDINGS: No acute fracture or dislocation.  No periosteal elevation or bone erosion. The bones are well mineralized. No arthritic changes. Diffuse soft tissue swelling of the index finger. No radiopaque foreign object or soft tissue gas. IMPRESSION: 1. No acute fracture or dislocation. 2. Diffuse soft tissue swelling of the index finger. Electronically Signed   By: Elgie Collard M.D.   On: 04/21/2023 23:28    Procedures Procedures    Medications Ordered in ED Medications  oxyCODONE-acetaminophen (PERCOCET/ROXICET) 5-325 MG per tablet 1 tablet (1 tablet Oral Given 04/22/23 0041)    ED Course/ Medical Decision Making/ A&P Clinical Course as of 04/22/23 0116  Wed Apr 22, 2023  0032 Spoke with Dr. Dallas Schimke, Hand.  Recommends soaking and attempting to reopen the incision to continue drainage.  Continue antibiotics.  Will provide follow-up. [CH]  0114 Patient hand was soaked.  On my evaluation, was easily able to express purulence from prior incision site without difficulty.  Stressed to the patient that he needs to make sure that he is soaking that finger 4 times a day for up to 20 minutes to keep the incision soft and continuing to drain.  He should continue his antibiotics and follow-up with Dr. Charlesetta Ivory.  We discussed strict return precautions. [CH]    Clinical Course User Index [CH] [name], Mayer Masker, MD                                 Medical Decision Making Amount and/or Complexity of Data Reviewed Labs: ordered. Radiology: ordered.  Risk OTC drugs. Prescription drug management.   This patient presents to the ED for concern of finger infection, this involves an extensive number of treatment options, and is a complaint that carries with it a high risk of complications and morbidity.  I considered the following differential and admission for this acute, potentially life threatening condition.  The differential diagnosis includes cellulitis, tenosynovitis, abscess, felon  MDM:    This is a 37 year old male who presents with  continued pain and redness of the left second digit.  He is nontoxic and vital signs are reassuring.  Physical exam is consistent with likely a felon with some mild cellulitis.  He does have some fusiform swelling but reasonable range of motion of that finger.  X-ray shows no evidence of foreign body or osteomyelitis.  Labs are normal.  He is not had any systemic symptoms.  Brief discussion with Dr. Dallas Schimke.  Recommends that he continue to soak 4 times a day to allow the felon to drain.  He was soaked in the emergency department with good drainage.  Do not see any  need for repeat incision.  Recommend continuing antibiotics.  He will follow-up with Dr. Dallas Schimke.  He was given strict return precautions.  (Labs, imaging, consults)  Labs: I Ordered, and personally interpreted labs.  The pertinent results include: CBC, CMP  Imaging Studies ordered: I ordered imaging studies including x-ray I independently visualized and interpreted imaging. I agree with the radiologist interpretation  Additional history obtained from chart review.  External records from outside source obtained and reviewed including urgent care notes  Cardiac Monitoring: The patient was not maintained on a cardiac monitor.  If on the cardiac monitor, I personally viewed and interpreted the cardiac monitored which showed an underlying rhythm of: N/A  Reevaluation: After the interventions noted above, I reevaluated the patient and found that they have :improved  Social Determinants of Health:  lives independently  Disposition: Discharge  Co morbidities that complicate the patient evaluation History reviewed. No pertinent past medical history.   Medicines Meds ordered this encounter  Medications   oxyCODONE-acetaminophen (PERCOCET/ROXICET) 5-325 MG per tablet 1 tablet   DISCONTD: oxyCODONE-acetaminophen (PERCOCET/ROXICET) 5-325 MG per tablet 1 tablet   chlorhexidine (HIBICLENS) 4 % external liquid    Sig: Apply topically daily  as needed.    Dispense:  118 mL    Refill:  0    I have reviewed the patients home medicines and have made adjustments as needed  Problem List / ED Course: Problem List Items Addressed This Visit   None Visit Diagnoses     Felon of finger of left hand    -  Primary                   Final Clinical Impression(s) / ED Diagnoses Final diagnoses:  Felon of finger of left hand    Rx / DC Orders ED Discharge Orders          Ordered    chlorhexidine (HIBICLENS) 4 % external liquid  Daily PRN        04/22/23 0116              Shon Baton, MD 04/22/23 [phone]
